# Patient Record
Sex: Male | Born: 1960 | Race: Black or African American | Hispanic: No | Marital: Single | Smoking: Current every day smoker
Health system: Southern US, Community
[De-identification: ages and names within clinical notes are randomized; demographics above are authoritative.]

---

## 2003-03-30 ENCOUNTER — Emergency Department (HOSPITAL_COMMUNITY): Admission: EM | Admit: 2003-03-30 | Discharge: 2003-03-30 | Payer: Self-pay | Admitting: Emergency Medicine

## 2003-04-29 ENCOUNTER — Emergency Department (HOSPITAL_COMMUNITY): Admission: EM | Admit: 2003-04-29 | Discharge: 2003-04-29 | Payer: Self-pay | Admitting: *Deleted

## 2007-02-20 ENCOUNTER — Emergency Department (HOSPITAL_COMMUNITY): Admission: EM | Admit: 2007-02-20 | Discharge: 2007-02-20 | Payer: Self-pay | Admitting: Emergency Medicine

## 2011-08-14 ENCOUNTER — Emergency Department (HOSPITAL_COMMUNITY): Payer: BC Managed Care – PPO

## 2011-08-14 ENCOUNTER — Encounter (HOSPITAL_COMMUNITY): Payer: Self-pay

## 2011-08-14 ENCOUNTER — Emergency Department (HOSPITAL_COMMUNITY)
Admission: EM | Admit: 2011-08-14 | Discharge: 2011-08-15 | Disposition: A | Discharge status: Home / Self Care | Payer: BC Managed Care – PPO | Attending: Emergency Medicine | Admitting: Emergency Medicine

## 2011-08-14 DIAGNOSIS — J189 Pneumonia, unspecified organism: Secondary | ICD-10-CM | POA: Insufficient documentation

## 2011-08-14 DIAGNOSIS — R05 Cough: Secondary | ICD-10-CM | POA: Insufficient documentation

## 2011-08-14 DIAGNOSIS — R61 Generalized hyperhidrosis: Secondary | ICD-10-CM | POA: Insufficient documentation

## 2011-08-14 DIAGNOSIS — R059 Cough, unspecified: Secondary | ICD-10-CM | POA: Insufficient documentation

## 2011-08-14 DIAGNOSIS — R509 Fever, unspecified: Secondary | ICD-10-CM | POA: Insufficient documentation

## 2011-08-14 DIAGNOSIS — R079 Chest pain, unspecified: Secondary | ICD-10-CM | POA: Insufficient documentation

## 2011-08-14 DIAGNOSIS — R634 Abnormal weight loss: Secondary | ICD-10-CM | POA: Insufficient documentation

## 2011-08-14 LAB — COMPREHENSIVE METABOLIC PANEL
Albumin: 3.4 g/dL — ABNORMAL LOW (ref 3.5–5.2)
Alkaline Phosphatase: 72 U/L (ref 39–117)
CO2: 25 mEq/L (ref 19–32)
Chloride: 93 mEq/L — ABNORMAL LOW (ref 96–112)
GFR calc Af Amer: 86 mL/min — ABNORMAL LOW (ref >90)
GFR calc non Af Amer: 74 mL/min — ABNORMAL LOW (ref >90)
Potassium: 4.2 mEq/L (ref 3.5–5.1)
Sodium: 129 mEq/L — ABNORMAL LOW (ref 135–145)

## 2011-08-14 LAB — DIFFERENTIAL
Basophils Relative: 0 % (ref 0–1)
Eosinophils Absolute: 0 10*3/uL (ref 0.0–0.7)
Eosinophils Relative: 0 % (ref 0–5)
Lymphocytes Relative: 14 % (ref 12–46)
Monocytes Relative: 21 % — ABNORMAL HIGH (ref 3–12)

## 2011-08-14 LAB — CBC
HCT: 35.3 % — ABNORMAL LOW (ref 39.0–52.0)
MCV: 93.6 fL (ref 78.0–100.0)

## 2011-08-14 LAB — CARDIAC PANEL(CRET KIN+CKTOT+MB+TROPI)
CK, MB: 3 ng/mL (ref 0.3–4.0)
Total CK: 256 U/L — ABNORMAL HIGH (ref 7–232)

## 2011-08-14 LAB — PROTIME-INR
INR: 1.09 (ref 0.00–1.49)
Prothrombin Time: 14.3 seconds (ref 11.6–15.2)

## 2011-08-14 LAB — LACTATE DEHYDROGENASE: LDH: 153 U/L (ref 94–250)

## 2011-08-14 MED ORDER — MOXIFLOXACIN HCL IN NACL 400 MG/250ML IV SOLN
400.0000 mg | Freq: Once | INTRAVENOUS | Status: AC
  Administered 2011-08-14: 400 mg via INTRAVENOUS
  Filled 2011-08-14: qty 250

## 2011-08-14 NOTE — ED Notes (Signed)
Pt c/o cough for a couple months but developed cp today that increases with inspiration.

## 2011-08-14 NOTE — ED Provider Notes (Signed)
History  This chart was scribed for Jay Octave, MD by Cherlynn Perches. The patient was seen in room APA04/APA04. Patient's care was started at 2123.  CSN: [id number]  Arrival date & time 08/14/11  2123   First MD Initiated Contact with Patient 08/14/11 2215      Chief Complaint  Patient presents with  . Chest Pain  . Cough    (Consider location/radiation/quality/duration/timing/severity/associated sxs/prior treatment) HPI  Jay Goodman is a 51 y.o. male who presents to the Emergency Department complaining of 2 months of gradually worsening, constant, moderate productive cough with associated 12 hours of chest pain, weight loss, diaphoresis, and darkening of urine. Pt's temperature was measured at 100.5 upon arrival to ED. Pt states that he took nyquil about 2 days ago without much relief. Pt also reports that he took Mucinex today and began developing chest pain localized to the left chest shortly after. Pt is a current everyday smoker and reports using alcohol. Pt denies using IV medications   History reviewed. No pertinent past medical history.  History reviewed. No pertinent past surgical history.  History reviewed. No pertinent family history.  History  Substance Use Topics  . Smoking status: Current Everyday Smoker -- 1.0 packs/day  . Smokeless tobacco: Not on file  . Alcohol Use: Yes      Review of Systems  Constitutional: Positive for fever, diaphoresis and unexpected weight change.  HENT: Negative for ear pain and neck pain.   Respiratory: Positive for cough. Negative for shortness of breath.   Cardiovascular: Positive for chest pain.  Gastrointestinal: Negative for nausea and vomiting.  Musculoskeletal: Negative for back pain.  Neurological: Negative for weakness, light-headedness and headaches.  All other systems reviewed and are negative.    Allergies  Review of patient's allergies indicates no known allergies.  Home Medications   Current  Outpatient Rx  Name Route Sig Dispense Refill  . GUAIFENESIN ER 600 MG PO TB12 Oral Take 600 mg by mouth 2 (two) times daily.    Marland Kitchen MOXIFLOXACIN HCL 400 MG PO TABS Oral Take 1 tablet (400 mg total) by mouth daily. 7 tablet 0    Triage Vitals: BP 118/76  Pulse 92  Temp(Src) 100.5 F (38.1 C) (Oral)  Resp 18  Ht 5\' 9"  (1.753 m)  Wt 130 lb (58.968 kg)  BMI 19.20 kg/m2  SpO2 97%  Physical Exam  Nursing note and vitals reviewed. Constitutional: He is oriented to person, place, and time. He appears well-developed and well-nourished.       Question jaundice, but pt states normal color  HENT:  Head: Normocephalic and atraumatic.  Eyes: Conjunctivae are normal. No scleral icterus.  Neck: Normal range of motion. Neck supple.       No meningismus  Cardiovascular: Normal rate and regular rhythm.   Pulmonary/Chest: Effort normal. No respiratory distress.       Coarse rhonchi bilaterally  Abdominal: Soft. There is no tenderness.  Musculoskeletal: Normal range of motion. He exhibits no edema.  Neurological: He is alert and oriented to person, place, and time.  Skin: Skin is warm and dry.  Psychiatric: He has a normal mood and affect. His behavior is normal.    ED Course  Procedures (including critical care time)  DIAGNOSTIC STUDIES: Oxygen Saturation is 97% on room air, adequate by my interpretation.    COORDINATION OF CARE: 10:25PM - will get blood tests and chest x-ray. Pt agrees with plan.    Labs Reviewed  CBC - Abnormal; Notable for  the following:    RBC 3.77 (*)    Hemoglobin 12.3 (*)    HCT 35.3 (*)    All other components within normal limits  DIFFERENTIAL - Abnormal; Notable for the following:    Monocytes Relative 21 (*)    Monocytes Absolute 2.0 (*)    All other components within normal limits  COMPREHENSIVE METABOLIC PANEL - Abnormal; Notable for the following:    Sodium 129 (*)    Chloride 93 (*)    Glucose, Bld 109 (*)    Albumin 3.4 (*)    GFR calc non Af  Amer 74 (*)    GFR calc Af Amer 86 (*)    All other components within normal limits  CARDIAC PANEL(CRET KIN+CKTOT+MB+TROPI) - Abnormal; Notable for the following:    Total CK 256 (*)    All other components within normal limits  D-DIMER, QUANTITATIVE - Abnormal; Notable for the following:    D-Dimer, Quant 0.98 (*)    All other components within normal limits  URINALYSIS, ROUTINE W REFLEX MICROSCOPIC - Abnormal; Notable for the following:    Ketones, ur 15 (*)    Protein, ur 30 (*)    Urobilinogen, UA >8.0 (*)    All other components within normal limits  PROTIME-INR  LACTATE DEHYDROGENASE  URINE MICROSCOPIC-ADD ON  LAB REPORT - SCANNED   Dg Chest 2 View  08/14/2011  *RADIOLOGY REPORT*  Clinical Data: Cough, fever.  CHEST - 2 VIEW  Comparison: 02/20/2007  Findings: Left lower lobe and lingular consolidation, partially obscures the left heart border. Mild peribronchial cuffing.  Heart size and mediastinal contours otherwise within normal limits.  No pleural effusion or pneumothorax.  No definite acute osseous finding.  IMPRESSION: Left lingular/lower lobe consolidation, concerning for pneumonia. Recommend repeat radiograph after therapy to document resolution.  Original Report Authenticated By: Waneta Martins, M.D.   Ct Angio Chest W/cm &/or Wo Cm  08/15/2011  *RADIOLOGY REPORT*  Clinical Data: Cough and chest pain.  Diaphoresis.  CT ANGIOGRAPHY CHEST  Technique:  Multidetector CT imaging of the chest using the standard protocol during bolus administration of intravenous contrast. Multiplanar reconstructed images including MIPs were obtained and reviewed to evaluate the vascular anatomy.  Contrast: OMNIPAQUE IOHEXOL 350 MG/ML SOLN  Comparison: None.  Findings: There is no filling defect within the opacified pulmonary arteries to suggest the presence of an acute pulmonary embolus. There is no thoracic aortic aneurysm.  No dissection of the thoracic aorta.  No axillary lymphadenopathy.   There is borderline mediastinal lymphadenopathy with a 9 mm precarinal lymph node.  The 7 mm short axis lymph node is identified in the subcarinal station.  16 mm lymph node is seen in the right hilum.  13 mm short axis lymph node is seen in the left hilum.  Heart size is normal.  There is no pericardial or pleural effusion.  Lung windows show a tree in bud opacity involving the right middle lobe and lingula.  There is confluent airspace consolidation in the lingula and in the central left lower lobe.  Airway impaction is seen in the lower lobes bilaterally with areas of associated tree in bud opacity. There are some scattered areas of mild bronchiectasis.  Bone windows reveal no worrisome lytic or sclerotic osseous lesions.  IMPRESSION: Mild bilateral hilar lymphadenopathy is associated with the patchy areas of tree in bud opacity in the right middle lobe, lingula, both lower lobes.  There is a airway impaction noted in both lower lobes.  Overall imaging features may be related to bronchopneumonia or atypical infection.  Original Report Authenticated By: ERIC A. MANSELL, M.D.     1. CAP (community acquired pneumonia)       MDM  2 months of cough has worsened over the past several days. Today associated with left-sided pleuritic chest pain, clear mucus production and fever to 100.5. Patient denies any medical problems but appears to be cachectic and possibly jaundiced. He also endorses some episodes of sweating and darkening of his urine.  LFTs wnl, CXR with lingular opacity. D-dimer positive.  CT pending at time of sign out to Dr. Hyacinth Meeker.   Date: 08/14/2011  Rate: 92  Rhythm:normal sinus  QRS Axis: normal  Intervals: normal  ST/T Wave abnormalities: normal  Conduction Disutrbances:none  Narrative Interpretation:   Old EKG Reviewed: none available      I personally performed the services described in this documentation, which was scribed in my presence.  The recorded information has been  reviewed and considered.     Jay Octave, MD 08/15/11 1446

## 2011-08-15 LAB — URINALYSIS, ROUTINE W REFLEX MICROSCOPIC
Leukocytes, UA: NEGATIVE
Nitrite: NEGATIVE
Urobilinogen, UA: >8 mg/dL — ABNORMAL HIGH (ref 0.0–1.0)
pH: 7 (ref 5.0–8.0)

## 2011-08-15 LAB — URINE MICROSCOPIC-ADD ON

## 2011-08-15 MED ORDER — IOHEXOL 350 MG/ML SOLN
100.0000 mL | Freq: Once | INTRAVENOUS | Status: AC | PRN
  Administered 2011-08-15: 100 mL via INTRAVENOUS

## 2011-08-15 MED ORDER — MOXIFLOXACIN HCL 400 MG PO TABS: 400.0000 mg | ORAL_TABLET | Freq: Every day | ORAL | Status: AC

## 2011-08-15 NOTE — ED Notes (Signed)
CT scan of the chest reviewed, no signs of pulmonary embolism, patient treated for pneumonia with Avelox, prescription for home. Patient appears stable with no hypoxia or tachycardia or hypotension here.  Vida Roller, MD 08/15/11 [phone]

## 2011-08-15 NOTE — Discharge Instructions (Signed)

## 2011-08-16 MED FILL — Moxifloxacin HCl Tab 400 MG (Base Equiv): ORAL | Qty: 1 | Status: AC

## 2011-08-30 ENCOUNTER — Ambulatory Visit (HOSPITAL_COMMUNITY)
Admission: RE | Admit: 2011-08-30 | Discharge: 2011-08-30 | Disposition: A | Discharge status: Home / Self Care | Payer: BC Managed Care – PPO | Source: Ambulatory Visit | Attending: Family Medicine | Admitting: Family Medicine

## 2011-08-30 ENCOUNTER — Other Ambulatory Visit (HOSPITAL_COMMUNITY): Payer: Self-pay | Admitting: Family Medicine

## 2011-08-30 DIAGNOSIS — Z09 Encounter for follow-up examination after completed treatment for conditions other than malignant neoplasm: Secondary | ICD-10-CM

## 2011-08-30 DIAGNOSIS — J180 Bronchopneumonia, unspecified organism: Secondary | ICD-10-CM | POA: Insufficient documentation

## 2013-05-17 IMAGING — CT CT ANGIO CHEST
2 of 6 series · 7 of 36 positions shown · IV contrast (Omnipaque 300)
Comparison: None.

CLINICAL DATA: Cough and chest pain.  Diaphoresis.

CT ANGIOGRAPHY CHEST
TECHNIQUE: Multidetector CT imaging of the chest using the
standard protocol during bolus administration of intravenous
contrast. Multiplanar reconstructed images including MIPs were
obtained and reviewed to evaluate the vascular anatomy.
Contrast: 100mL OMNIPAQUE IOHEXOL 350 MG/ML SOLN

[Series 4: pe 3.0 b40f · axial · 0.62mm/px · z∈[-218,-22]mm · 4 of 109 slices shown]
[im 22/109  lung]
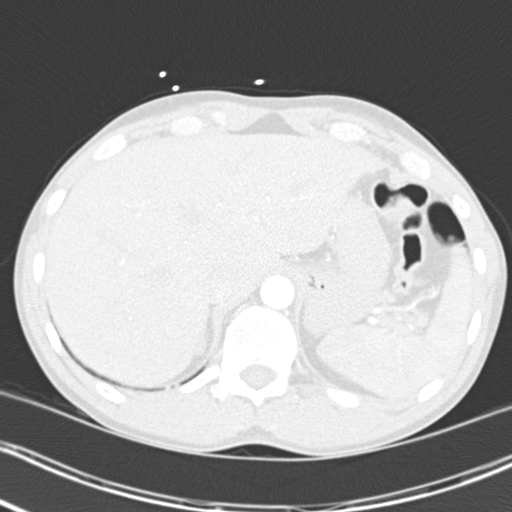
[im 44/109  mediastinal]
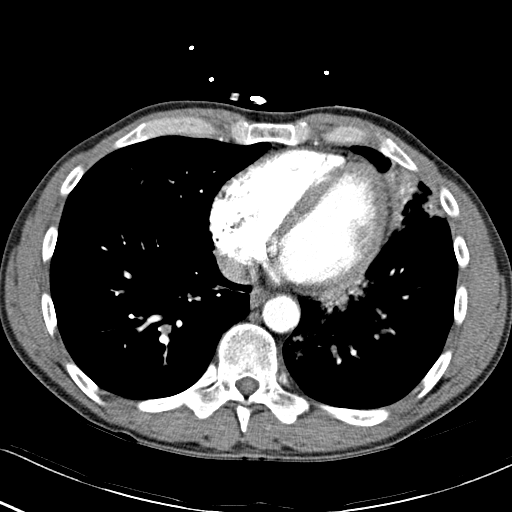
[im 65/109  lung]
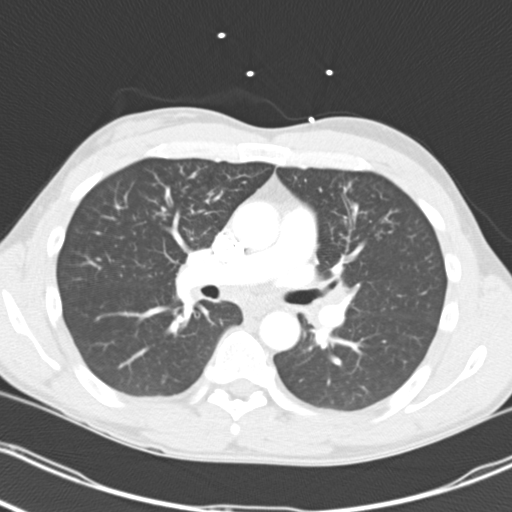
[im 87/109  mediastinal]
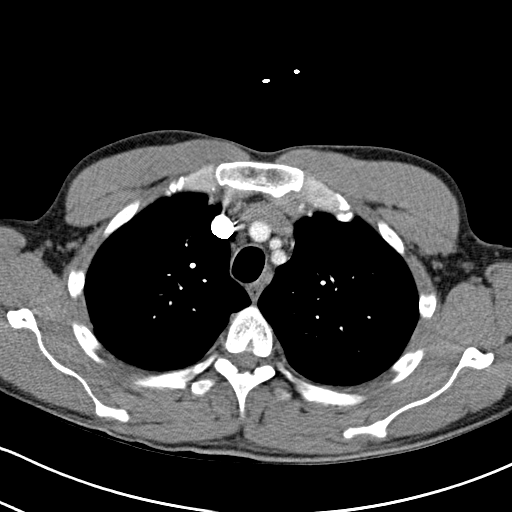

[Series 6: mpr coronal pe 3mm · coronal · 0.61mm/px · 3 of 77 slices shown]
[im 16/77  mediastinal]
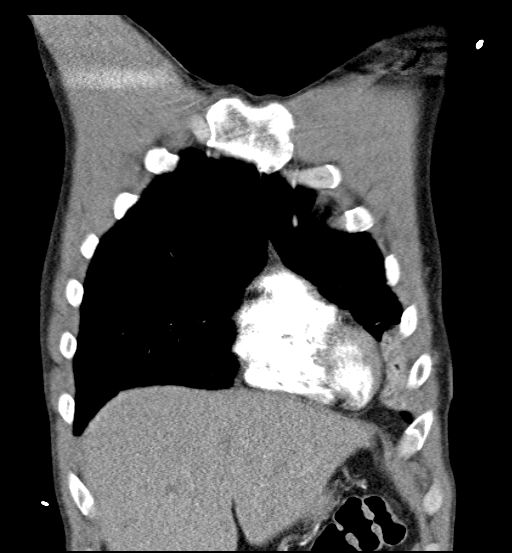
[im 31/77  mediastinal]
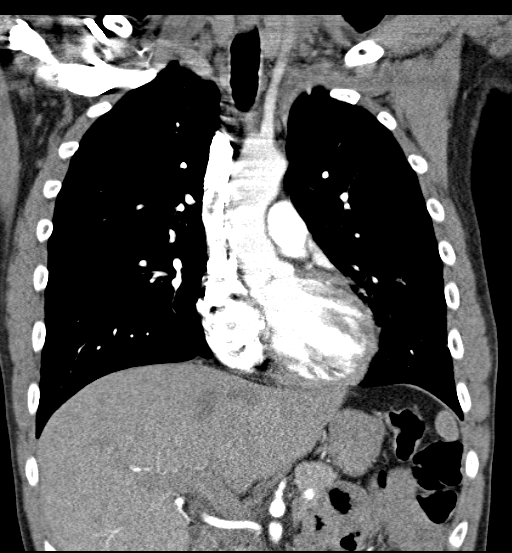
[im 46/77  mediastinal]
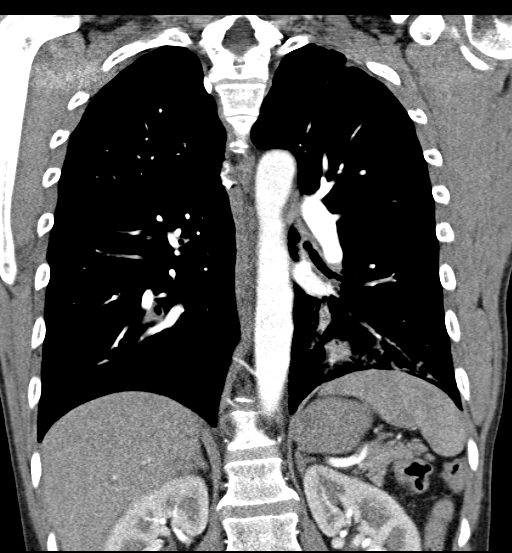

[7 of 36 positions shown; findings below may reference images not displayed]

FINDINGS: There is no filling defect within the opacified pulmonary
arteries to suggest the presence of an acute pulmonary embolus.
There is no thoracic aortic aneurysm.  No dissection of the
thoracic aorta.

No axillary lymphadenopathy.  There is borderline mediastinal
lymphadenopathy with a 9 mm precarinal lymph node.  The 7 mm short
axis lymph node is identified in the subcarinal station.  16 mm
lymph node is seen in the right hilum.  13 mm short axis lymph node
is seen in the left hilum.  Heart size is normal.  There is no
pericardial or pleural effusion.

Lung windows show a tree in bud opacity involving the right middle
lobe and lingula.  There is confluent airspace consolidation in the
lingula and in the central left lower lobe.  Airway impaction is
seen in the lower lobes bilaterally with areas of associated tree
in bud opacity. There are some scattered areas of mild
bronchiectasis.

Bone windows reveal no worrisome lytic or sclerotic osseous
lesions.
IMPRESSION: Mild bilateral hilar lymphadenopathy is associated with the patchy
areas of tree in bud opacity in the right middle lobe, lingula,
both lower lobes.  There is a airway impaction noted in both lower
lobes.  Overall imaging features may be related to bronchopneumonia
or atypical infection.

## 2014-11-10 ENCOUNTER — Emergency Department (HOSPITAL_COMMUNITY)
Admission: EM | Admit: 2014-11-10 | Discharge: 2014-11-10 | Disposition: A | Discharge status: Home / Self Care | Payer: BLUE CROSS/BLUE SHIELD | Attending: Emergency Medicine | Admitting: Emergency Medicine

## 2014-11-10 ENCOUNTER — Encounter (HOSPITAL_COMMUNITY): Payer: Self-pay | Admitting: Emergency Medicine

## 2014-11-10 DIAGNOSIS — J209 Acute bronchitis, unspecified: Secondary | ICD-10-CM | POA: Insufficient documentation

## 2014-11-10 DIAGNOSIS — Z720 Tobacco use: Secondary | ICD-10-CM | POA: Insufficient documentation

## 2014-11-10 DIAGNOSIS — Z79899 Other long term (current) drug therapy: Secondary | ICD-10-CM | POA: Insufficient documentation

## 2014-11-10 MED ORDER — MOXIFLOXACIN HCL 400 MG PO TABS: 400.0000 mg | ORAL_TABLET | Freq: Every day | ORAL | Status: AC

## 2014-11-10 NOTE — Discharge Instructions (Signed)
Acute Bronchitis Bronchitis is inflammation of the airways that extend from the windpipe into the lungs (bronchi). The inflammation often causes mucus to develop. This leads to a cough, which is the most common symptom of bronchitis.  In acute bronchitis, the condition usually develops suddenly and goes away over time, usually in a couple weeks. Smoking, allergies, and asthma can make bronchitis worse. Repeated episodes of bronchitis may cause further lung problems.  CAUSES Acute bronchitis is most often caused by the same virus that causes a cold. The virus can spread from person to person (contagious) through coughing, sneezing, and touching contaminated objects. SIGNS AND SYMPTOMS   Cough.   Fever.   Coughing up mucus.   Body aches.   Chest congestion.   Chills.   Shortness of breath.   Sore throat.  DIAGNOSIS  Acute bronchitis is usually diagnosed through a physical exam. Your health care provider will also ask you questions about your medical history. Tests, such as chest X-rays, are sometimes done to rule out other conditions.  TREATMENT  Acute bronchitis usually goes away in a couple weeks. Oftentimes, no medical treatment is necessary. Medicines are sometimes given for relief of fever or cough. Antibiotic medicines are usually not needed but may be prescribed in certain situations. In some cases, an inhaler may be recommended to help reduce shortness of breath and control the cough. A cool mist vaporizer may also be used to help thin bronchial secretions and make it easier to clear the chest.  HOME CARE INSTRUCTIONS  Get plenty of rest.   Drink enough fluids to keep your urine clear or pale yellow (unless you have a medical condition that requires fluid restriction). Increasing fluids may help thin your respiratory secretions (sputum) and reduce chest congestion, and it will prevent dehydration.   Take medicines only as directed by your health care provider.  If  you were prescribed an antibiotic medicine, finish it all even if you start to feel better.  Avoid smoking and secondhand smoke. Exposure to cigarette smoke or irritating chemicals will make bronchitis worse. If you are a smoker, consider using nicotine gum or skin patches to help control withdrawal symptoms. Quitting smoking will help your lungs heal faster.   Reduce the chances of another bout of acute bronchitis by washing your hands frequently, avoiding people with cold symptoms, and trying not to touch your hands to your mouth, nose, or eyes.   Keep all follow-up visits as directed by your health care provider.  SEEK MEDICAL CARE IF: Your symptoms do not improve after 1 week of treatment.  SEEK IMMEDIATE MEDICAL CARE IF:  You develop an increased fever or chills.   You have chest pain.   You have severe shortness of breath.  You have bloody sputum.   You develop dehydration.  You faint or repeatedly feel like you are going to pass out.  You develop repeated vomiting.  You develop a severe headache. MAKE SURE YOU:   Understand these instructions.  Will watch your condition.  Will get help right away if you are not doing well or get worse. Document Released: 04/22/2004 Document Revised: 07/30/2013 Document Reviewed: 09/05/2012 [facility] Patient Information 2015 [place], [place]. This information is not intended to replace advice given to you by your health care provider. Make sure you discuss any questions you have with your health care provider.  Smoking Cessation Quitting smoking is important to your health and has many advantages. However, it is not always easy to quit since nicotine  is a very addictive drug. Oftentimes, people try 3 times or more before being able to quit. This document explains the best ways for you to prepare to quit smoking. Quitting takes hard work and a lot of effort, but you can do it. ADVANTAGES OF QUITTING SMOKING  You will live longer, feel  better, and live better.  Your body will feel the impact of quitting smoking almost immediately.  Within 20 minutes, blood pressure decreases. Your pulse returns to its normal level.  After 8 hours, carbon monoxide levels in the blood return to normal. Your oxygen level increases.  After 24 hours, the chance of having a heart attack starts to decrease. Your breath, hair, and body stop smelling like smoke.  After 48 hours, damaged nerve endings begin to recover. Your sense of taste and smell improve.  After 72 hours, the body is virtually free of nicotine. Your bronchial tubes relax and breathing becomes easier.  After 2 to 12 weeks, lungs can hold more air. Exercise becomes easier and circulation improves.  The risk of having a heart attack, stroke, cancer, or lung disease is greatly reduced.  After 1 year, the risk of coronary heart disease is cut in half.  After 5 years, the risk of stroke falls to the same as a nonsmoker.  After 10 years, the risk of lung cancer is cut in half and the risk of other cancers decreases significantly.  After 15 years, the risk of coronary heart disease drops, usually to the level of a nonsmoker.  If you are pregnant, quitting smoking will improve your chances of having a healthy baby.  The people you live with, especially any children, will be healthier.  You will have extra money to spend on things other than cigarettes. QUESTIONS TO THINK ABOUT BEFORE ATTEMPTING TO QUIT You may want to talk about your answers with your health care provider.  Why do you want to quit?  If you tried to quit in the past, what helped and what did not?  What will be the most difficult situations for you after you quit? How will you plan to handle them?  Who can help you through the tough times? Your family? Friends? A health care provider?  What pleasures do you get from smoking? What ways can you still get pleasure if you quit? Here are some questions to ask  your health care provider:  How can you help me to be successful at quitting?  What medicine do you think would be best for me and how should I take it?  What should I do if I need more help?  What is smoking withdrawal like? How can I get information on withdrawal? GET READY  Set a quit date.  Change your environment by getting rid of all cigarettes, ashtrays, matches, and lighters in your home, car, or work. Do not let people smoke in your home.  Review your past attempts to quit. Think about what worked and what did not. GET SUPPORT AND ENCOURAGEMENT You have a better chance of being successful if you have help. You can get support in many ways.  Tell your family, friends, and coworkers that you are going to quit and need their support. Ask them not to smoke around you.  Get individual, group, or telephone counseling and support. Programs are available at [employer] and health centers. Call your local health department for information about programs in your area.  Spiritual beliefs and practices may help some smokers quit.  Download a "quit meter" on your computer to keep track of quit statistics, such as how long you have gone without smoking, cigarettes not smoked, and money saved.  Get a self-help book about quitting smoking and staying off tobacco. LEARN NEW SKILLS AND BEHAVIORS  Distract yourself from urges to smoke. Talk to someone, go for a walk, or occupy your time with a task.  Change your normal routine. Take a different route to work. Drink tea instead of coffee. Eat breakfast in a different place.  Reduce your stress. Take a hot bath, exercise, or read a book.  Plan something enjoyable to do every day. Reward yourself for not smoking.  Explore interactive web-based programs that specialize in helping you quit. GET MEDICINE AND USE IT CORRECTLY Medicines can help you stop smoking and decrease the urge to smoke. Combining medicine with the above behavioral  methods and support can greatly increase your chances of successfully quitting smoking.  Nicotine replacement therapy helps deliver nicotine to your body without the negative effects and risks of smoking. Nicotine replacement therapy includes nicotine gum, lozenges, inhalers, nasal sprays, and skin patches. Some may be available over-the-counter and others require a prescription.  Antidepressant medicine helps people abstain from smoking, but how this works is unknown. This medicine is available by prescription.  Nicotinic receptor partial agonist medicine simulates the effect of nicotine in your brain. This medicine is available by prescription. Ask your health care provider for advice about which medicines to use and how to use them based on your health history. Your health care provider will tell you what side effects to look out for if you choose to be on a medicine or therapy. Carefully read the information on the package. Do not use any other product containing nicotine while using a nicotine replacement product.  RELAPSE OR DIFFICULT SITUATIONS Most relapses occur within the first 3 months after quitting. Do not be discouraged if you start smoking again. Remember, most people try several times before finally quitting. You may have symptoms of withdrawal because your body is used to nicotine. You may crave cigarettes, be irritable, feel very hungry, cough often, get headaches, or have difficulty concentrating. The withdrawal symptoms are only temporary. They are strongest when you first quit, but they will go away within 10-14 days. To reduce the chances of relapse, try to:  Avoid drinking alcohol. Drinking lowers your chances of successfully quitting.  Reduce the amount of caffeine you consume. Once you quit smoking, the amount of caffeine in your body increases and can give you symptoms, such as a rapid heartbeat, sweating, and anxiety.  Avoid smokers because they can make you want to  smoke.  Do not let weight gain distract you. Many smokers will gain weight when they quit, usually less than 10 pounds. Eat a healthy diet and stay active. You can always lose the weight gained after you quit.  Find ways to improve your mood other than smoking. FOR MORE INFORMATION  www.smokefree.gov  Document Released: 03/09/2001 Document Revised: 07/30/2013 Document Reviewed: 06/24/2011 [facility] Patient Information 2015 [place], [place]. This information is not intended to replace advice given to you by your health care provider. Make sure you discuss any questions you have with your health care provider.  Smoking Hazards Smoking cigarettes is extremely bad for your health. Tobacco smoke has over 200 known poisons in it. It contains the poisonous gases nitrogen oxide and carbon monoxide. There are over 60 chemicals in tobacco smoke that cause cancer. Some of the  chemicals found in cigarette smoke include:   Cyanide.   Benzene.   Formaldehyde.   Methanol (wood alcohol).   Acetylene (fuel used in welding torches).   Ammonia.  Even smoking lightly shortens your life expectancy by several years. You can greatly reduce the risk of medical problems for you and your family by stopping now. Smoking is the most preventable cause of death and disease in our society. Within days of quitting smoking, your circulation improves, you decrease the risk of having a heart attack, and your lung capacity improves. There may be some increased phlegm in the first few days after quitting, and it may take months for your lungs to clear up completely. Quitting for 10 years reduces your risk of developing lung cancer to almost that of a nonsmoker.  WHAT ARE THE RISKS OF SMOKING? Cigarette smokers have an increased risk of many serious medical problems, including:  Lung cancer.   Lung disease (such as pneumonia, bronchitis, and emphysema).   Heart attack and chest pain due to the heart not getting  enough oxygen (angina).   Heart disease and peripheral blood vessel disease.   Hypertension.   Stroke.   Oral cancer (cancer of the lip, mouth, or voice box).   Bladder cancer.   Pancreatic cancer.   Cervical cancer.   Pregnancy complications, including premature birth.   Stillbirths and smaller newborn babies, birth defects, and genetic damage to sperm.   Early menopause.   Lower estrogen level for women.   Infertility.   Facial wrinkles.   Blindness.   Increased risk of broken bones (fractures).   Senile dementia.   Stomach ulcers and internal bleeding.   Delayed wound healing and increased risk of complications during surgery. Because of secondhand smoke exposure, children of smokers have an increased risk of the following:   Sudden infant death syndrome (SIDS).   Respiratory infections.   Lung cancer.   Heart disease.   Ear infections.  WHY IS SMOKING ADDICTIVE? Nicotine is the chemical agent in tobacco that is capable of causing addiction or dependence. When you smoke and inhale, nicotine is absorbed rapidly into the bloodstream through your lungs. Both inhaled and noninhaled nicotine may be addictive.  WHAT ARE THE BENEFITS OF QUITTING?  There are many health benefits to quitting smoking. Some are:   The likelihood of developing cancer and heart disease decreases. Health improvements are seen almost immediately.   Blood pressure, pulse rate, and breathing patterns start returning to normal soon after quitting.   People who quit may see an improvement in their overall quality of life.  HOW DO YOU QUIT SMOKING? Smoking is an addiction with both physical and psychological effects, and longtime habits can be hard to change. Your health care provider can recommend:  Programs and community resources, which may include group support, education, or therapy.  Replacement products, such as patches, gum, and nasal sprays. Use these  products only as directed. Do not replace cigarette smoking with electronic cigarettes (commonly called e-cigarettes). The safety of e-cigarettes is unknown, and some may contain harmful chemicals. FOR MORE INFORMATION  American Lung Association: www.lung.org  American Cancer Society: www.cancer.org Document Released: 04/22/2004 Document Revised: 01/03/2013 Document Reviewed: 09/04/2012 [facility] Patient Information 2015 [place], [place]. This information is not intended to replace advice given to you by your health care provider. Make sure you discuss any questions you have with your health care provider.

## 2014-11-10 NOTE — ED Provider Notes (Signed)
CSN: [id number]     Arrival date & time 11/10/14  1921 History   First MD Initiated Contact with Patient 11/10/14 1930     Chief Complaint  Patient presents with  . Shortness of Breath     (Consider location/radiation/quality/duration/timing/severity/associated sxs/prior Treatment) HPI   Jay Goodman is a 54 y.o. male resents for evaluation of cough, shortness of breath, sputum production and wheezing, for 3 days. He had similar problem one year ago and was treated with Avelox with improvement. He requests a prescription for Avelox. He denies headache, chest pain, weakness, dizziness, nausea or vomiting. He is not interested in smoking cessation. There are no other known modifying factors.      History reviewed. No pertinent past medical history. History reviewed. No pertinent past surgical history. No family history on file. Social History  Substance Use Topics  . Smoking status: Current Some Day Smoker -- 1.00 packs/day  . Smokeless tobacco: None  . Alcohol Use: Yes    Review of Systems  All other systems reviewed and are negative.     Allergies  Review of patient's allergies indicates no known allergies.  Home Medications   Prior to Admission medications   Medication Sig Start Date End Date Taking? Authorizing Provider  guaiFENesin (MUCINEX) 600 MG 12 hr tablet Take 600 mg by mouth 2 (two) times daily.    Historical Provider, MD  moxifloxacin (AVELOX) 400 MG tablet Take 1 tablet (400 mg total) by mouth daily at 8 pm. 11/10/14   Mancel Bale, MD   BP 133/94 mmHg  Pulse 93  Temp(Src) 98.1 F (36.7 C) (Oral)  Resp 18  Ht  (1.753 m)  Wt 148 lb (67.132 kg)  BMI 21.85 kg/m2  SpO2 96% Physical Exam  Constitutional: He is oriented to person, place, and time. He appears well-developed and well-nourished.  HENT:  Head: Normocephalic and atraumatic.  Right Ear: External ear normal.  Left Ear: External ear normal.  Eyes: Conjunctivae and EOM are normal.  Pupils are equal, round, and reactive to light.  Neck: Normal range of motion and phonation normal. Neck supple.  Cardiovascular: Normal rate, regular rhythm and normal heart sounds.   Pulmonary/Chest: Effort normal. No respiratory distress. He has no rales. He exhibits no bony tenderness.  Few scattered wheezes and rhonchi.  Abdominal: Soft. There is no tenderness.  Musculoskeletal: Normal range of motion.  Neurological: He is alert and oriented to person, place, and time. No cranial nerve deficit or sensory deficit. He exhibits normal muscle tone. Coordination normal.  Skin: Skin is warm, dry and intact.  Psychiatric: He has a normal mood and affect. His behavior is normal. Judgment and thought content normal.  Nursing note and vitals reviewed.   ED Course  Procedures (including critical care time)  I discussed with the patient, all questions were answered.  Labs Review Labs Reviewed - No data to display  Imaging Review No results found. I, Ariany Kesselman L, personally reviewed and evaluated these images and lab results as part of my medical decision-making.   EKG Interpretation None      MDM   Final diagnoses:  Acute bronchitis, unspecified organism  Tobacco abuse    Evaluation is consistent with tobacco related bronchitis. No evidence for pneumonia, spasm, or metabolic instability.  Nursing Notes Reviewed/ Care Coordinated Applicable Imaging Reviewed Interpretation of Laboratory Data incorporated into ED treatment  The patient appears reasonably screened and/or stabilized for discharge and I doubt any other medical condition or other [facility] requiring  further screening, evaluation, or treatment in the ED at this time prior to discharge.  Plan: Home Medications- Avelox; Home Treatments- Stop smoking; return here if the recommended treatment, does not improve the symptoms; Recommended follow up- PCP of choice prn     Mancel Bale, MD 11/10/14 2005

## 2014-11-10 NOTE — ED Notes (Signed)
Pt. Reports shortness of breath starting Friday. Pt. Reports cough. Pt. Denies any pain.

## 2020-03-13 ENCOUNTER — Ambulatory Visit: Payer: Self-pay | Attending: Internal Medicine

## 2020-03-13 DIAGNOSIS — Z23 Encounter for immunization: Secondary | ICD-10-CM

## 2020-03-13 NOTE — Progress Notes (Signed)
   Covid-19 Vaccination Clinic  Name:  Jay Goodman    MRN: [id number] DOB: 04-05-1960  03/13/2020  [name] was observed post Covid-19 immunization for 15 minutes without incident. He was provided with Vaccine Information Sheet and instruction to access the V-Safe system.   [name] was instructed to call 911 with any severe reactions post vaccine: Marland Kitchen Difficulty breathing  . Swelling of face and throat  . A fast heartbeat  . A bad rash all over body  . Dizziness and weakness   Immunizations Administered    Name Date Dose VIS Date Route   Pfizer COVID-19 Vaccine 03/13/2020  1:13 PM 0.3 mL 01/16/2020 Intramuscular   Manufacturer: [employer], [employer]   Lot: [id number]   NDC: [address]-7

## 2022-07-03 LAB — AMB RESULTS CONSOLE CBG: Glucose: 147

## 2023-11-29 ENCOUNTER — Emergency Department (HOSPITAL_COMMUNITY): Admission: EM | Admit: 2023-11-29 | Discharge: 2023-11-29 | Disposition: A | Discharge status: Home / Self Care

## 2023-11-29 ENCOUNTER — Other Ambulatory Visit: Payer: Self-pay

## 2023-11-29 ENCOUNTER — Encounter (HOSPITAL_COMMUNITY): Payer: Self-pay

## 2023-11-29 DIAGNOSIS — R1013 Epigastric pain: Secondary | ICD-10-CM | POA: Diagnosis present

## 2023-11-29 DIAGNOSIS — K2900 Acute gastritis without bleeding: Secondary | ICD-10-CM | POA: Diagnosis not present

## 2023-11-29 LAB — COMPREHENSIVE METABOLIC PANEL WITH GFR
ALT: 15 U/L (ref 0–44)
AST: 20 U/L (ref 15–41)
Albumin: 3.9 g/dL (ref 3.5–5.0)
Alkaline Phosphatase: 83 U/L (ref 38–126)
Anion gap: 9 (ref 5–15)
BUN: 15 mg/dL (ref 6–20)
CO2: 25 mmol/L (ref 22–32)
Calcium: 9.3 mg/dL (ref 8.9–10.3)
Chloride: 98 mmol/L (ref 98–111)
Creatinine, Ser: 1.22 mg/dL (ref 0.61–1.24)
GFR, Estimated: >60 mL/min (ref >60)
Glucose, Bld: 99 mg/dL (ref 70–99)
Potassium: 4 mmol/L (ref 3.5–5.1)
Sodium: 132 mmol/L — ABNORMAL LOW (ref 135–145)
Total Bilirubin: 0.8 mg/dL (ref 0.0–1.2)
Total Protein: 7.4 g/dL (ref 6.5–8.1)

## 2023-11-29 LAB — CBC WITH DIFFERENTIAL/PLATELET
Abs Immature Granulocytes: 0.03 K/uL (ref 0.00–0.07)
Basophils Absolute: 0.1 K/uL (ref 0.0–0.1)
Basophils Relative: 1 %
Eosinophils Absolute: 0.2 K/uL (ref 0.0–0.5)
Eosinophils Relative: 2 %
HCT: 41.6 % (ref 39.0–52.0)
Hemoglobin: 14.2 g/dL (ref 13.0–17.0)
Immature Granulocytes: 0 %
Lymphocytes Relative: 39 %
Lymphs Abs: 2.9 K/uL (ref 0.7–4.0)
MCH: 32.6 pg (ref 26.0–34.0)
MCHC: 34.1 g/dL (ref 30.0–36.0)
MCV: 95.6 fL (ref 80.0–100.0)
Monocytes Absolute: 0.9 K/uL (ref 0.1–1.0)
Monocytes Relative: 12 %
Neutro Abs: 3.4 K/uL (ref 1.7–7.7)
Neutrophils Relative %: 46 %
Platelets: 304 K/uL (ref 150–400)
RBC: 4.35 MIL/uL (ref 4.22–5.81)
RDW: 12.9 % (ref 11.5–15.5)
WBC: 7.5 K/uL (ref 4.0–10.5)
nRBC: 0 % (ref 0.0–0.2)

## 2023-11-29 LAB — URINALYSIS, ROUTINE W REFLEX MICROSCOPIC
Bacteria, UA: NONE SEEN
Bilirubin Urine: NEGATIVE
Glucose, UA: NEGATIVE mg/dL
Ketones, ur: NEGATIVE mg/dL
Leukocytes,Ua: NEGATIVE
Nitrite: NEGATIVE
Protein, ur: NEGATIVE mg/dL
Specific Gravity, Urine: 1.021 (ref 1.005–1.030)
pH: 5 (ref 5.0–8.0)

## 2023-11-29 LAB — LIPASE, BLOOD: Lipase: 32 U/L (ref 11–51)

## 2023-11-29 MED ORDER — ALUM & MAG HYDROXIDE-SIMETH 200-200-20 MG/5ML PO SUSP
30.0000 mL | Freq: Once | ORAL | Status: AC
  Administered 2023-11-29: 30 mL via ORAL
  Filled 2023-11-29: qty 30

## 2023-11-29 MED ORDER — FAMOTIDINE 20 MG PO TABS: 20.0000 mg | ORAL_TABLET | Freq: Two times a day (BID) | ORAL | 0 refills | Status: AC

## 2023-11-29 MED ORDER — FAMOTIDINE 20 MG PO TABS
40.0000 mg | ORAL_TABLET | Freq: Once | ORAL | Status: AC
  Administered 2023-11-29: 40 mg via ORAL
  Filled 2023-11-29: qty 2

## 2023-11-29 NOTE — Discharge Instructions (Addendum)
 I do think this is likely gastritis versus GERD.  Please try the famotidine  to see if this helps.  Take this as prescribed.  Please avoid any kind of fatty foods or spicy foods.  Follow-up with gastroenterology.  Please give them a call and establish care.  If experiencing, worsening pain or any kind of chest pain or shortness of breath and please come back to the ED immediately.

## 2023-11-29 NOTE — ED Provider Notes (Signed)
 [place] EMERGENCY DEPARTMENT AT [facility] Provider Note   CSN: [id number] Arrival date & time: 11/29/23  1750     Patient presents with: Abdominal Pain   ARNE SCHLENDER is a 63 y.o. male.  {Add pertinent medical, surgical, social history, OB history to HPI:32947}  Abdominal Pain  presents because of some abdominal discomfort.  Patient states that it is more epigastric in nature.  Patient states he will eat and subsequently start feeling some discomfort.  Patient states that he took some Tums earlier today because he felt like this area was gurgling.  Tums seem to make the feeling improved but subsequent return.  Takes no long-acting antacid.  Patient states he also took MiraLAX yesterday see this would help.  Did have a bowel movement.  Still passing flatulence.  No previous abdominal surgeries.  No dark stools.  No bright red blood per rectum.  No chest pain or shortness of breath.  Otherwise, feeling within baseline.     Prior to Admission medications   Medication Sig Start Date End Date Taking? Authorizing Provider  guaiFENesin (MUCINEX) 600 MG 12 hr tablet Take 600 mg by mouth 2 (two) times daily.    [provider]  moxifloxacin  (AVELOX ) 400 MG tablet Take 1 tablet (400 mg total) by mouth daily at 8 pm. 11/10/14   Lorriane Holmes, MD    Allergies: Patient has no known allergies.    Review of Systems  Gastrointestinal:  Positive for abdominal pain.    Updated Vital Signs BP (!) 130/98 (BP Location: Right Arm)   Pulse 77   Temp 98.4 F (36.9 C) (Oral)   Resp 16   Ht 5' 9 (1.753 m)   Wt 67.1 kg   SpO2 100%   BMI 21.85 kg/m   Physical Exam Vitals and nursing note reviewed.  Constitutional:      General: He is not in acute distress.    Appearance: He is well-developed.  HENT:     Head: Normocephalic and atraumatic.  Eyes:     Conjunctiva/sclera: Conjunctivae normal.  Cardiovascular:     Rate and Rhythm: Normal rate and regular rhythm.      Heart sounds: No murmur heard. Pulmonary:     Effort: Pulmonary effort is normal. No respiratory distress.     Breath sounds: Normal breath sounds.  Abdominal:     Palpations: Abdomen is soft.     Tenderness: There is no abdominal tenderness.   Musculoskeletal:        General: No swelling.     Cervical back: Neck supple.  Skin:    General: Skin is warm and dry.     Capillary Refill: Capillary refill takes less than 2 seconds.  Neurological:     Mental Status: He is alert.  Psychiatric:        Mood and Affect: Mood normal.     (all labs ordered are listed, but only abnormal results are displayed) Labs Reviewed  COMPREHENSIVE METABOLIC PANEL WITH GFR - Abnormal; Notable for the following components:      Result Value   Sodium 132 (*)    All other components within normal limits  URINALYSIS, ROUTINE W REFLEX MICROSCOPIC - Abnormal; Notable for the following components:   Hgb urine dipstick SMALL (*)    All other components within normal limits  LIPASE, BLOOD  CBC WITH DIFFERENTIAL/PLATELET    EKG: None  Radiology: No results found.  {Document cardiac monitor, telemetry assessment procedure when appropriate:32947} Procedures  Medications Ordered in the ED - No data to display    {Click here for ABCD2, HEART and other calculators REFRESH Note before signing:1}                              Medical Decision Making Amount and/or Complexity of Data Reviewed Labs: ordered.  Risk OTC drugs.   presents because of some abdominal discomfort.  Patient states that it is more epigastric in nature.  Patient states he will eat and subsequently start feeling some discomfort.  Patient states that he took some Tums earlier today because he felt like this area was gurgling.  Tums seem to make the feeling improved but subsequent return.  Takes no long-acting antacid.  Patient states he also took MiraLAX yesterday see this would help.  Did have a bowel movement.  Still passing  flatulence.  No previous abdominal surgeries.  No dark stools.  No bright red blood per rectum.  No chest pain or shortness of breath.  Otherwise, feeling within baseline.    Upon exam, patient medically stable.     Slight pain to palpation epigastric area.  Negative Murphy sign.  No RUQ tenderness.  {Document critical care time when appropriate  Document review of labs and clinical decision tools ie CHADS2VASC2, etc  Document your independent review of radiology images and any outside records  Document your discussion with family members, caretakers and with consultants  Document social determinants of health affecting pt's care  Document your decision making why or why not admission, treatments were needed:32947:::1}   Final diagnoses:  None    ED Discharge Orders     None

## 2023-11-29 NOTE — ED Triage Notes (Signed)
 Pt arrived via POV c/o epigastric abdominal pain that has been intermittent for past couple of weeks. Pt reports trying laxatives at home PTA and was concerned he may be constipated. Pt reports minimal relief and last BM was yesterday.
